# Patient Record
Sex: Male | Born: 1995 | Race: Black or African American | Hispanic: No | Marital: Single | Smoking: Current some day smoker
Health system: Southern US, Community
[De-identification: ages and names within clinical notes are randomized; demographics above are authoritative.]

---

## 2017-05-02 ENCOUNTER — Emergency Department (HOSPITAL_COMMUNITY): Payer: BLUE CROSS/BLUE SHIELD

## 2017-05-02 ENCOUNTER — Encounter (HOSPITAL_COMMUNITY): Payer: Self-pay | Admitting: Emergency Medicine

## 2017-05-02 ENCOUNTER — Emergency Department (HOSPITAL_COMMUNITY)
Admission: EM | Admit: 2017-05-02 | Discharge: 2017-05-02 | Disposition: A | Discharge status: Home / Self Care | Payer: BLUE CROSS/BLUE SHIELD | Attending: Emergency Medicine | Admitting: Emergency Medicine

## 2017-05-02 DIAGNOSIS — R0789 Other chest pain: Secondary | ICD-10-CM | POA: Diagnosis not present

## 2017-05-02 DIAGNOSIS — R079 Chest pain, unspecified: Secondary | ICD-10-CM | POA: Diagnosis present

## 2017-05-02 DIAGNOSIS — K30 Functional dyspepsia: Secondary | ICD-10-CM | POA: Diagnosis not present

## 2017-05-02 DIAGNOSIS — F17210 Nicotine dependence, cigarettes, uncomplicated: Secondary | ICD-10-CM | POA: Diagnosis not present

## 2017-05-02 LAB — BASIC METABOLIC PANEL
ANION GAP: 10 (ref 5–15)
BUN: 11 mg/dL (ref 6–20)
CALCIUM: 9.6 mg/dL (ref 8.9–10.3)
CO2: 27 mmol/L (ref 22–32)
Chloride: 101 mmol/L (ref 101–111)
Creatinine, Ser: 1.03 mg/dL (ref 0.61–1.24)
Glucose, Bld: 93 mg/dL (ref 65–99)
POTASSIUM: 4.5 mmol/L (ref 3.5–5.1)
Sodium: 138 mmol/L (ref 135–145)

## 2017-05-02 LAB — CBC
HCT: 46.8 % (ref 39.0–52.0)
HEMOGLOBIN: 16.3 g/dL (ref 13.0–17.0)
MCH: 30.5 pg (ref 26.0–34.0)
MCHC: 34.8 g/dL (ref 30.0–36.0)
MCV: 87.6 fL (ref 78.0–100.0)
Platelets: 238 10*3/uL (ref 150–400)
RBC: 5.34 MIL/uL (ref 4.22–5.81)
RDW: 13.6 % (ref 11.5–15.5)
WBC: 8.9 10*3/uL (ref 4.0–10.5)

## 2017-05-02 LAB — POCT I-STAT TROPONIN I: TROPONIN I, POC: 0 ng/mL (ref 0.00–0.08)

## 2017-05-02 MED ORDER — IBUPROFEN 600 MG PO TABS: 600.0000 mg | ORAL_TABLET | Freq: Three times a day (TID) | ORAL | 0 refills | Status: DC

## 2017-05-02 MED ORDER — GI COCKTAIL ~~LOC~~
30.0000 mL | Freq: Once | ORAL | Status: AC
  Administered 2017-05-02: 30 mL via ORAL
  Filled 2017-05-02: qty 30

## 2017-05-02 MED ORDER — KETOROLAC TROMETHAMINE 15 MG/ML IJ SOLN
30.0000 mg | Freq: Once | INTRAMUSCULAR | Status: AC
  Administered 2017-05-02: 30 mg via INTRAMUSCULAR
  Filled 2017-05-02: qty 2

## 2017-05-02 NOTE — ED Provider Notes (Signed)
[facility]-EMERGENCY DEPT Provider Note   CSN: [id number] Arrival date & time: 05/02/17  1140  History   Chief Complaint Chief Complaint  Patient presents with  . Chest Pain    HPI Jared BudgeDeven Muns is a 21 y.o. male.  HPI   21 year old male presents today with complaints of chest tightness.  Patient notes last night he started to develop upper abdominal discomfort and chest tightness.  He reports the comfort was noted in the central chest and upper abdomen.  He reports that laying down caused worsening of symptoms, and seemed to feel restless last night.  Patient notes associated nausea and one episode of vomiting this morning.  Patient denies any preceding infectious etiology including fever chills, cough congestion shortness of breath.  Patient notes that he had a general sensation of tightness and minor shortness of breath that has improved now.  Patient reports going [facility] the health clinic [facility] and being referred to the ED. patient denies any symptoms similar to this previous.  Patient denies any drug or alcohol use.  Patient reports he is a non-smoker, but did smoke several times throughout the summer.  He denies any cardiac history in himself or his family.  Denies any risk factors for DVT or PE.  No lower abdominal pain, or changes in bowel or bladder habits/characteristics.  Patient did not take any medications prior to arrival.  History reviewed. No pertinent past medical history.  There are no active problems to display for this patient.   History reviewed. No pertinent surgical history.    Home Medications    Prior to Admission medications   Medication Sig Start Date End Date Taking? Authorizing Provider  ibuprofen (ADVIL,MOTRIN) 600 MG tablet Take 1 tablet (600 mg total) by mouth 3 (three) times daily. 05/02/17   Eyvonne MechanicHedges, Thom Ollinger, PA-C    Family History History reviewed. No pertinent family history.  Social History Social History  Substance Use Topics  . Smoking status:  Current Some Day Smoker  . Smokeless tobacco: Not on file  . Alcohol use No     Allergies   Penicillins   Review of Systems Review of Systems  All other systems reviewed and are negative.  Physical Exam Updated Vital Signs BP 126/70 (BP Location: Right Arm)   Pulse 90   Temp 98.5 F (36.9 C) (Oral)   Resp 18   Ht 5\' 11"  (1.803 m)   Wt 65.8 kg (145 lb)   SpO2 99%   BMI 20.22 kg/m   Physical Exam  Constitutional: He is oriented to person, place, and time. He appears well-developed and well-nourished.  HENT:  Head: Normocephalic and atraumatic.  Eyes: Pupils are equal, round, and reactive to light. Conjunctivae are normal. Right eye exhibits no discharge. Left eye exhibits no discharge. No scleral icterus.  Neck: Normal range of motion. No JVD present. No tracheal deviation present.  Cardiovascular: Normal rate, regular rhythm and intact distal pulses.  Exam reveals no gallop and no friction rub.   Murmur heard. Soft systolic murmur   Pulmonary/Chest: Effort normal and breath sounds normal. No stridor. No respiratory distress. He has no wheezes. He has no rales. He exhibits no tenderness.  Abdominal: Soft. He exhibits no distension and no mass. There is no tenderness. There is no rebound and no guarding. No hernia.  Musculoskeletal: Normal range of motion. He exhibits no edema.  Neurological: He is alert and oriented to person, place, and time. Coordination normal.  Skin: Skin is warm.  Psychiatric: He  has a normal mood and affect. His behavior is normal. Judgment and thought content normal.  Nursing note and vitals reviewed.   ED Treatments / Results  Labs (all labs ordered are listed, but only abnormal results are displayed) Labs Reviewed  BASIC METABOLIC PANEL  CBC  I-STAT TROPONIN, ED  POCT I-STAT TROPONIN I    EKG  EKG Interpretation  Date/Time:  Wednesday May 02 2017 11:49:48 EDT Ventricular Rate:  87 PR Interval:    QRS Duration: 85 QT  Interval:  355 QTC Calculation: 427 R Axis:   90 Text Interpretation:  Sinus rhythm Borderline right axis deviation ST elevation suggests acute pericarditis No old tracing to compare Confirmed by Mancel Bale [phone]) on 05/02/2017 12:50:08 PM      Radiology Dg Chest 2 View  Result Date: 05/02/2017 CLINICAL DATA:  Chest pain and shortness of breath EXAM: CHEST  2 VIEW COMPARISON:  None. FINDINGS: Lungs are clear. Heart size and pulmonary vascularity are normal. No adenopathy. No pneumothorax. There is midthoracic dextroscoliosis. There is slight thoracic lordosis. IMPRESSION: No edema or consolidation. Electronically Signed   By: Bretta Bang III M.D.   On: 05/02/2017 12:22    Procedures Procedures (including critical care time)  Medications Ordered in ED Medications  ketorolac (TORADOL) 15 MG/ML injection 30 mg (30 mg Intramuscular Given 05/02/17 1336)  gi cocktail (Maalox,Lidocaine,Donnatal) (30 mLs Oral Given 05/02/17 1439)     Initial Impression / Assessment and Plan / ED Course  I have reviewed the triage vital signs and the nursing notes.  Pertinent labs & imaging results that were available during my care of the patient were reviewed by me and considered in my medical decision making (see chart for details).     Final Clinical Impressions(s) / ED Diagnoses   Final diagnoses:  Atypical chest pain  Indigestion   Labs:i stat trop, bmp, cbc  Imaging: DG chest 2 view  Consults:  Therapeutics:  Discharge Meds:   Assessment/Plan: 21 year old male presents today with chest pain.  Patient has what appears to be indigestion, but also has chest pain symptoms and EKG that is does show questionable diffuse ST elevation.  Question pericarditis in this patient.  He is hemodynamically stable, no signs of acute infection, very well-appearing.  Toradol completely resolved the symptoms, he will be referred to cardiology, NSAIDs, strict return precautions.  Patient verbalized  understanding and agreement to today's plan had no further questions or concerns the time discharge.     New Prescriptions New Prescriptions   IBUPROFEN (ADVIL,MOTRIN) 600 MG TABLET    Take 1 tablet (600 mg total) by mouth 3 (three) times daily.     Eyvonne Mechanic, PA-C 05/02/17 1524    Mancel Bale, MD 05/04/17 [phone]

## 2017-05-02 NOTE — Discharge Instructions (Signed)
Please read attached information. If you experience any new or worsening signs or symptoms please return to the emergency room for evaluation. Please follow-up with your primary care provider or specialist as discussed. Please use medication prescribed only as directed and discontinue taking if you have any concerning signs or symptoms.   °

## 2017-05-02 NOTE — ED Triage Notes (Addendum)
Pt reports SOB, central chest pain and tightness, abdominal pain onset last night, 1 episode yellow bile emesis today at 0700, causing relief of pain and decreased SOB. Chest still feels mildly tight. Pt initially went to health center, which sent him here due to EKG abnormalities.

## 2017-05-07 ENCOUNTER — Encounter: Payer: Self-pay | Admitting: Cardiology

## 2017-05-07 ENCOUNTER — Ambulatory Visit (INDEPENDENT_AMBULATORY_CARE_PROVIDER_SITE_OTHER): Payer: BLUE CROSS/BLUE SHIELD | Admitting: Cardiology

## 2017-05-07 DIAGNOSIS — R079 Chest pain, unspecified: Secondary | ICD-10-CM

## 2017-05-07 NOTE — Progress Notes (Signed)
Cardiology Office Note:    Date:  05/07/2017   ID:  Jared Price, DOB [birth year], MRN [id number]  PCP:  Patient, No Pcp Per  Cardiologist:  Garwin Brothers, MD   Referring MD: No ref. provider found    ASSESSMENT:    1. Chest pain, unspecified type    PLAN:    In order of problems listed above:  1. I reassured the patient about my findings today. I mentioned to him that I agree with the initial evaluation suggesting the possibility of pericarditis. Admit the patient to the breaths and cough with which he had no reproduction of the symptoms. He is taking ibuprofen activity. I told him that he could discontinue in a day or 2. Precautions and risks explained and he verbalized understanding. Again he is completely asymptomatic at this time. I would like to stress him with the complaint treadmill stress test and an echocardiogram to reassured him about a aforementioned episode. He'll be seen in follow-up appointment in 2 months or earlier if any concerns. He knows to go to the nearest emergency room for any significant concerns.   Medication Adjustments/Labs and Tests Ordered: Current medicines are reviewed at length with the patient today.  Concerns regarding medicines are outlined above.  Orders Placed This Encounter  Procedures  . ECHOCARDIOGRAM COMPLETE   No orders of the defined types were placed in this encounter.    History of Present Illness:    Jared Price is a 21 y.o. male who is being seen today for the evaluation of Chest pain at the request of emergency room physician. The patient was in the emergency room on the 15th. He mentions to me that he started having some chest discomfort and some shortness of breath and to the emergency room. In the emergency room his initial evaluation was unremarkable and the patient was told that they were not sure as to what his chest pain was coming from. They considered pericarditis as a possibility and treated him for this. The patient has  done better. He upon questioning mentions to me that he did have chest pain when taking deep breaths. At the time of my evaluation is alert awake oriented and in no distress and is accompanied by mother. He tells me that all his symptoms have now resolved. He is taking ibuprofen as prescribed by the physician. Emergency room records at extensive length. The patient and his mother had multiple questions were answered to their satisfaction.  History reviewed. No pertinent past medical history.  History reviewed. No pertinent surgical history.  Current Medications: Current Meds  Medication Sig  . ibuprofen (ADVIL,MOTRIN) 600 MG tablet Take 1 tablet (600 mg total) by mouth 3 (three) times daily.     Allergies:   Penicillins   Social History   Social History  . Marital status: Single    Spouse name: N/A  . Number of children: N/A  . Years of education: N/A   Social History Main Topics  . Smoking status: Current Some Day Smoker    Types: Cigarettes  . Smokeless tobacco: Never Used  . Alcohol use Yes  . Drug use: No  . Sexual activity: Not Asked   Other Topics Concern  . None   Social History Narrative  . None     Family History: The patient's family history includes Atrial fibrillation in his maternal grandfather.  ROS:   Please see the history of present illness.    All other systems reviewed and are negative.  EKGs/Labs/Other Studies Reviewed:    The following studies were reviewed today: I reviewed emergency room records and answered to patient's satisfaction.   Recent Labs: 05/02/2017: BUN 11; Creatinine, Ser 1.03; Hemoglobin 16.3; Platelets 238; Potassium 4.5; Sodium 138  Recent Lipid Panel No results found for: CHOL, TRIG, HDL, CHOLHDL, VLDL, LDLCALC, LDLDIRECT  Physical Exam:    VS:  BP 106/74   Pulse 86   Ht 6' (1.829 m)   Wt 141 lb 1.3 oz (64 kg)   SpO2 99%   BMI 19.13 kg/m     Wt Readings from Last 3 Encounters:  05/07/17 141 lb 1.3 oz (64 kg) (28  %, Z= -0.59)*  05/02/17 145 lb (65.8 kg) (34 %, Z= -0.40)*   * Growth percentiles are based on CDC 2-20 Years data.     GEN: Patient is in no acute distress HEENT: Normal NECK: No JVD; No carotid bruits LYMPHATICS: No lymphadenopathy CARDIAC: S1 S2 regular, 2/6 systolic murmur at the apex. RESPIRATORY:  Clear to auscultation without rales, wheezing or rhonchi  ABDOMEN: Soft, non-tender, non-distended MUSCULOSKELETAL:  No edema; No deformity  SKIN: Warm and dry NEUROLOGIC:  Alert and oriented x 3 PSYCHIATRIC:  Normal affect    Signed, Garwin Brothers, MD  05/07/2017 12:03 PM    [facility]

## 2017-05-07 NOTE — Patient Instructions (Signed)
Medication Instructions:  Your physician recommends that you continue on your current medications as directed. Please refer to the Current Medication list given to you today.   Labwork: NONE  Testing/Procedures: Your physician has requested that you have an echocardiogram. Echocardiography is a painless test that uses sound waves to create images of your heart. It provides your doctor with information about the size and shape of your heart and how well your heart's chambers and valves are working. This procedure takes approximately one hour. There are no restrictions for this procedure.  Your physician has requested that you have an exercise tolerance test. For further information please visit [email or web address] Please also follow instruction sheet, as given.    Follow-Up: Your physician recommends that you schedule a follow-up appointment in:  2 months    Any Other Special Instructions Will Be Listed Below (If Applicable).     If you need a refill on your cardiac medications before your next appointment, please call your pharmacy.

## 2017-05-07 NOTE — Addendum Note (Signed)
Addended by: Domingo Madeira on: 05/07/2017 12:27 PM   Modules accepted: Orders

## 2017-05-16 ENCOUNTER — Ambulatory Visit (HOSPITAL_COMMUNITY): Payer: BLUE CROSS/BLUE SHIELD | Attending: Cardiology

## 2017-05-16 ENCOUNTER — Ambulatory Visit (INDEPENDENT_AMBULATORY_CARE_PROVIDER_SITE_OTHER): Payer: BLUE CROSS/BLUE SHIELD

## 2017-05-16 ENCOUNTER — Other Ambulatory Visit: Payer: Self-pay

## 2017-05-16 DIAGNOSIS — R079 Chest pain, unspecified: Secondary | ICD-10-CM | POA: Insufficient documentation

## 2017-05-16 DIAGNOSIS — Z720 Tobacco use: Secondary | ICD-10-CM | POA: Insufficient documentation

## 2017-05-16 DIAGNOSIS — I341 Nonrheumatic mitral (valve) prolapse: Secondary | ICD-10-CM | POA: Insufficient documentation

## 2017-05-16 LAB — EXERCISE TOLERANCE TEST
CSEPHR: 97 %
CSEPPHR: 196 {beats}/min
Estimated workload: 15.3 METS
Exercise duration (min): 13 min
Exercise duration (sec): 0 s
MPHR: 201 {beats}/min
RPE: 15
Rest HR: 70 {beats}/min

## 2017-07-09 ENCOUNTER — Ambulatory Visit: Payer: BLUE CROSS/BLUE SHIELD | Admitting: Cardiology

## 2017-07-17 ENCOUNTER — Ambulatory Visit (INDEPENDENT_AMBULATORY_CARE_PROVIDER_SITE_OTHER): Payer: BLUE CROSS/BLUE SHIELD | Admitting: Cardiology

## 2017-07-17 ENCOUNTER — Encounter: Payer: Self-pay | Admitting: Cardiology

## 2017-07-17 VITALS — BP 120/70 | HR 76 | Ht 72.0 in | Wt 151.0 lb

## 2017-07-17 DIAGNOSIS — R079 Chest pain, unspecified: Secondary | ICD-10-CM | POA: Diagnosis not present

## 2017-07-17 DIAGNOSIS — I341 Nonrheumatic mitral (valve) prolapse: Secondary | ICD-10-CM | POA: Diagnosis not present

## 2017-07-17 NOTE — Patient Instructions (Signed)
Medication Instructions:  Your physician recommends that you continue on your current medications as directed. Please refer to the Current Medication list given to you today.  Labwork: None  Testing/Procedures: None  Follow-Up: Your physician recommends that you schedule a follow-up appointment in: 6 months  Any Other Special Instructions Will Be Listed Below (If Applicable).     If you need a refill on your cardiac medications before your next appointment, please call your pharmacy.   [facility]  Edilson Vital A, RN, BSN  

## 2017-07-17 NOTE — Progress Notes (Signed)
Cardiology Office Note:    Date:  07/17/2017   ID:  Jared Price, DOB September 25, 1995, MRN [id number]  PCP:  Patient, No Pcp Per  Cardiologist:  Garwin Brothersajan R Rally Ouch, MD   Referring MD: No ref. provider found    ASSESSMENT:    1. Chest pain, unspecified type    PLAN:    In order of problems listed above:  1. I reassured the patient about my findings. I discussed the findings with him on echocardiogram and stress echocardiogram. Extensive length and he and his mother are reassured. I told him to get to active and exercises lifestyle. Patient uses weight and I discouraged and told to quit this. Adequate hydration was emphasized. Reassurance was given a stop his excellent performance on both the test. 2. I gave education about the mild mitral valve prolapse that he has.Patient will be seen in follow-up appointment in 6 months or earlier if the patient has any concerns.    Medication Adjustments/Labs and Tests Ordered: Current medicines are reviewed at length with the patient today.  Concerns regarding medicines are outlined above.  No orders of the defined types were placed in this encounter.  No orders of the defined types were placed in this encounter.    Chief Complaint  Patient presents with  . Follow-up    doing well. Follow up post echo and stress test      History of Present Illness:    Jared Price is a 21 y.o. male . The patient was evaluated by me for chest pain. His chest pain was atypical however because of his significant concern and wanting to do strenuous physical activity I sent him for a stress echocardiogram on which she has performed excellent. His echocardiogram also has been unremarkable with only mild mitral valve prolapse.  History reviewed. No pertinent past medical history.  History reviewed. No pertinent surgical history.  Current Medications: No outpatient prescriptions have been marked as taking for the 07/17/17 encounter (Office Visit) with Dariann Huckaba, Aundra Dubinajan  R, MD.     Allergies:   Penicillins   Social History   Social History  . Marital status: Single    Spouse name: N/A  . Number of children: N/A  . Years of education: N/A   Social History Main Topics  . Smoking status: Current Some Day Smoker    Types: Cigarettes  . Smokeless tobacco: Never Used  . Alcohol use Yes  . Drug use: No  . Sexual activity: Not Asked   Other Topics Concern  . None   Social History Narrative  . None     Family History: The patient's family history includes Atrial fibrillation in his maternal grandfather.  ROS:   Please see the history of present illness.    All other systems reviewed and are negative.  EKGs/Labs/Other Studies Reviewed:    The following studies were reviewed today: I reviewed findings and discussed with the patient at extensive length.   Recent Labs: 05/02/2017: BUN 11; Creatinine, Ser 1.03; Hemoglobin 16.3; Platelets 238; Potassium 4.5; Sodium 138  Recent Lipid Panel No results found for: CHOL, TRIG, HDL, CHOLHDL, VLDL, LDLCALC, LDLDIRECT  Physical Exam:    VS:  BP 120/70   Pulse 76   Ht 6' (1.829 m)   Wt 151 lb (68.5 kg)   SpO2 98%   BMI 20.48 kg/m     Wt Readings from Last 3 Encounters:  07/17/17 151 lb (68.5 kg) (43 %, Z= -0.16)*  05/07/17 141 lb 1.3 oz (64 kg) (  28 %, Z= -0.59)*  05/02/17 145 lb (65.8 kg) (34 %, Z= -0.40)*   * Growth percentiles are based on CDC 2-20 Years data.     GEN: Patient is in no acute distress HEENT: Normal NECK: No JVD; No carotid bruits LYMPHATICS: No lymphadenopathy CARDIAC: Hear sounds regular, 2/6 systolic murmur at the apex. RESPIRATORY:  Clear to auscultation without rales, wheezing or rhonchi  ABDOMEN: Soft, non-tender, non-distended MUSCULOSKELETAL:  No edema; No deformity  SKIN: Warm and dry NEUROLOGIC:  Alert and oriented x 3 PSYCHIATRIC:  Normal affect   Signed, Garwin Brothers, MD  07/17/2017 10:44 AM    [facility]

## 2018-09-07 IMAGING — CR DG CHEST 2V
2 series · 2 of 2 positions shown · non-contrast
Comparison: None.

CLINICAL DATA: Chest pain and shortness of breath

EXAM:
CHEST  2 VIEW

[w chest pa]
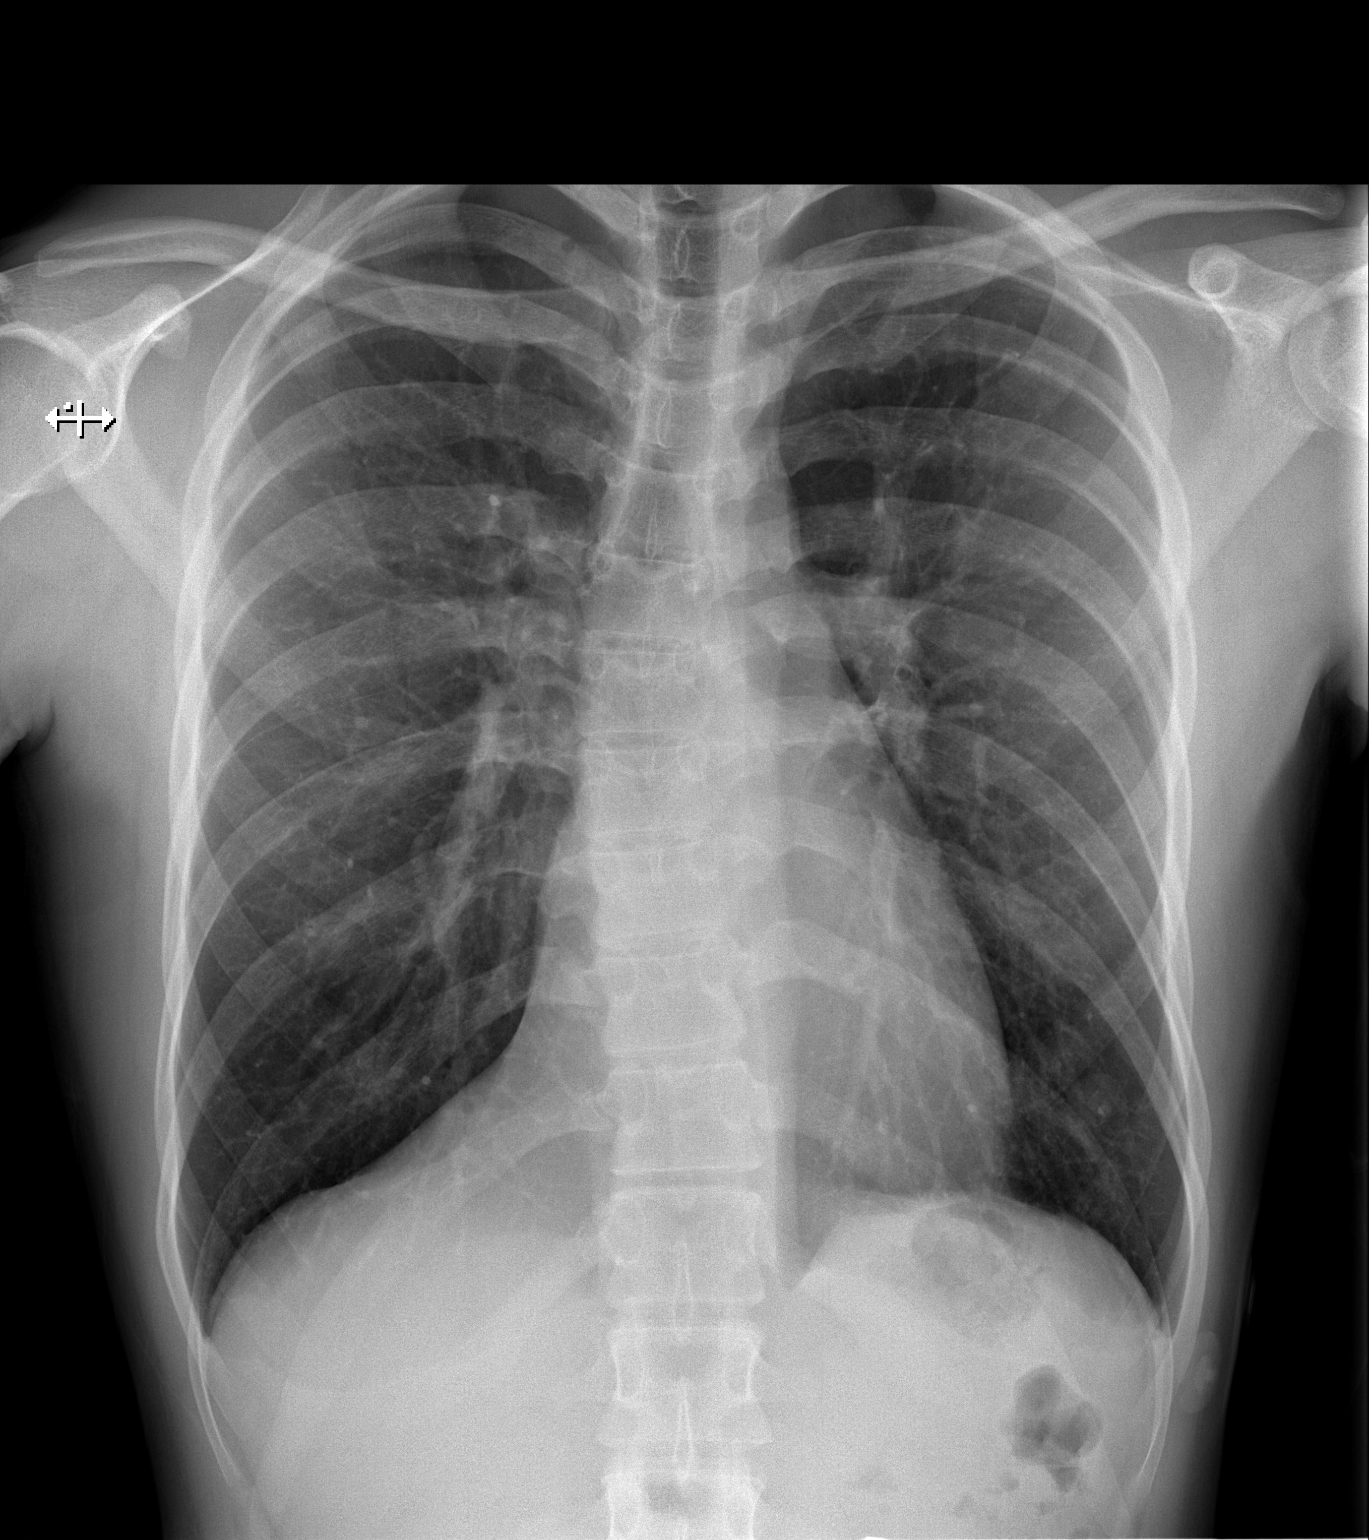

[w chest lat]
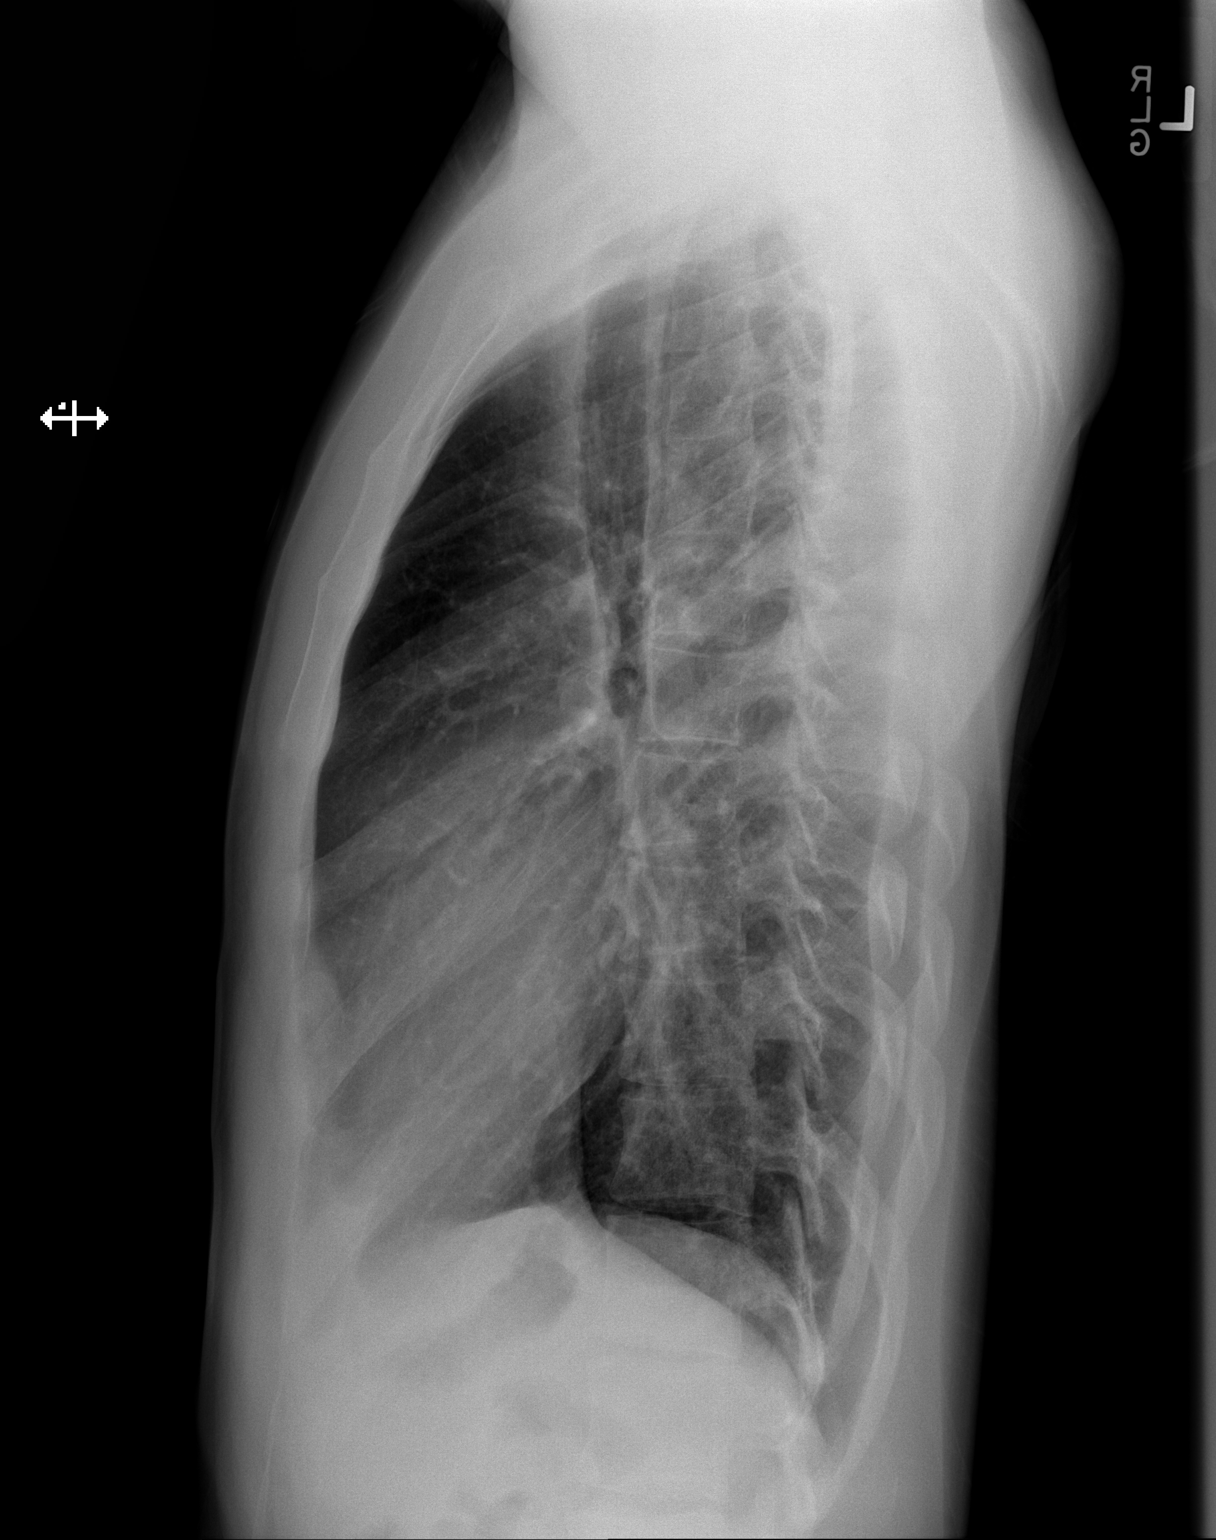

[2 of 2 positions shown; findings below may reference images not displayed]

FINDINGS: Lungs are clear. Heart size and pulmonary vascularity are normal. No
adenopathy. No pneumothorax. There is midthoracic dextroscoliosis.
There is slight thoracic lordosis.
IMPRESSION: No edema or consolidation.

## 2022-06-25 ENCOUNTER — Emergency Department (HOSPITAL_COMMUNITY): Payer: 59

## 2022-06-25 ENCOUNTER — Emergency Department (HOSPITAL_COMMUNITY)
Admission: EM | Admit: 2022-06-25 | Discharge: 2022-06-25 | Disposition: A | Discharge status: Home / Self Care | Payer: 59 | Attending: Emergency Medicine | Admitting: Emergency Medicine

## 2022-06-25 ENCOUNTER — Other Ambulatory Visit: Payer: Self-pay

## 2022-06-25 ENCOUNTER — Encounter (HOSPITAL_COMMUNITY): Payer: Self-pay

## 2022-06-25 DIAGNOSIS — S81812A Laceration without foreign body, left lower leg, initial encounter: Secondary | ICD-10-CM | POA: Diagnosis not present

## 2022-06-25 DIAGNOSIS — W25XXXA Contact with sharp glass, initial encounter: Secondary | ICD-10-CM | POA: Diagnosis not present

## 2022-06-25 DIAGNOSIS — S8992XA Unspecified injury of left lower leg, initial encounter: Secondary | ICD-10-CM | POA: Diagnosis present

## 2022-06-25 MED ORDER — LIDOCAINE-EPINEPHRINE 2 %-1:200000 IJ SOLN
10.0000 mL | Freq: Once | INTRAMUSCULAR | Status: AC
Start: 2022-06-25 — End: 2022-06-25
  Administered 2022-06-25: 10 mL
  Filled 2022-06-25: qty 20

## 2022-06-25 MED ORDER — AMMONIA AROMATIC IN INHA
RESPIRATORY_TRACT | Status: AC
  Filled 2022-06-25: qty 10

## 2022-06-25 MED ORDER — OXYCODONE-ACETAMINOPHEN 5-325 MG PO TABS
1.0000 | ORAL_TABLET | Freq: Once | ORAL | Status: AC
  Administered 2022-06-25: 1 via ORAL
  Filled 2022-06-25: qty 1

## 2022-06-25 NOTE — Discharge Instructions (Signed)
It was a pleasure taking care of you today!   Your xray didn't show any concerning findings. You may return to urgent care or return to the emergency department for suture removal in 7-10 days.  Keep the area clean and dry.  Return to the emergency department if worsening or persistent pain, drainage of wound (pus), increased swelling, or color change to area.

## 2022-06-25 NOTE — ED Provider Notes (Cosign Needed Addendum)
[facility]-EMERGENCY DEPT Provider Note   CSN: [id number] Arrival date & time: 06/25/22  1618     History  Chief Complaint  Patient presents with   Extremity Laceration    Jared Price is a 26 y.o. male who presents emergency department with concerns for right lower leg laceration onset 3 PM.  Notes that he dropped a glass which hit his lower leg.  Bleeding controlled at this time.  No anticoagulants.  Tetanus up-to-date.  No meds tried at home.   The history is provided by the patient. No language interpreter was used.       Home Medications Prior to Admission medications   Not on File      Allergies    Penicillins    Review of Systems   Review of Systems  Musculoskeletal:  Positive for arthralgias. Negative for joint swelling.  Skin:  Positive for wound.  All other systems reviewed and are negative.   Physical Exam Updated Vital Signs BP 128/88   Pulse 78   Temp 98 F (36.7 C)   Resp 20   Ht 6' (1.829 m)   Wt 68 kg   SpO2 100%   BMI 20.33 kg/m  Physical Exam Vitals and nursing note reviewed.  Constitutional:      General: He is not in acute distress.    Appearance: Normal appearance. He is not ill-appearing.  HENT:     Head: Normocephalic and atraumatic.     Right Ear: External ear normal.     Left Ear: External ear normal.  Eyes:     General: No scleral icterus. Cardiovascular:     Rate and Rhythm: Normal rate.  Pulmonary:     Effort: Pulmonary effort is normal.  Musculoskeletal:        General: Normal range of motion.     Cervical back: Normal range of motion and neck supple.     Comments: Patient able to flex and extend left foot without difficulty.  3 cm laceration noted to the left shin.   Skin:    General: Skin is warm and dry.     Capillary Refill: Capillary refill takes less than 2 seconds.     Findings: Laceration present.     Comments: 3 cm laceration noted to left shin.  Neurological:     Mental Status: He is  alert.     ED Results / Procedures / Treatments   Labs (all labs ordered are listed, but only abnormal results are displayed) Labs Reviewed - No data to display  EKG None  Radiology DG Tibia/Fibula Left  Result Date: 06/25/2022 CLINICAL DATA:  [id number] Laceration of left leg [id number] EXAM: LEFT TIBIA AND FIBULA - 2 VIEW COMPARISON:  None Available. FINDINGS: There is no evidence of fracture or other focal bone lesions. Knee and ankle grossly unremarkable. Soft tissue defect along the anterior leg. No retained radiopaque foreign body. IMPRESSION: 1. Soft tissue defect along the anterior leg. No retained radiopaque foreign body. 2.  No acute displaced fracture or dislocation. Electronically Signed   By: Tish Frederickson M.D.   On: 06/25/2022 17:26    Procedures .Marland KitchenLaceration Repair  Date/Time: 06/25/2022 6:15 PM  Performed by: Karenann Cai, PA-C Authorized by: Karenann Cai, PA-C   Consent:    Consent obtained:  Verbal and written   Consent given by:  Patient   Risks discussed:  Infection, pain and need for additional repair Universal protocol:    Imaging studies available: yes  Patient identity confirmed:  Verbally with patient and hospital-assigned identification number Anesthesia:    Anesthesia method:  Local infiltration   Local anesthetic:  Lidocaine 2% WITH epi Laceration details:    Location:  Leg   Leg location:  L lower leg   Length (cm):  3 Pre-procedure details:    Preparation:  Patient was prepped and draped in usual sterile fashion and imaging obtained to evaluate for foreign bodies Exploration:    Hemostasis achieved with:  Direct pressure and epinephrine   Imaging obtained: x-ray     Imaging outcome: foreign body not noted     Wound exploration: entire depth of wound visualized   Treatment:    Area cleansed with:  Saline   Amount of cleaning:  Standard   Irrigation solution:  Sterile saline   Irrigation method:  Syringe Skin repair:    Repair method:   Sutures   Suture size:  4-0   Suture material:  Prolene   Suture technique:  Simple interrupted   Number of sutures:  6 Approximation:    Approximation:  Close Repair type:    Repair type:  Simple Post-procedure details:    Dressing:  Sterile dressing and non-adherent dressing   Procedure completion:  Tolerated well, no immediate complications     Medications Ordered in ED Medications  ammonia inhalant (has no administration in time range)  oxyCODONE-acetaminophen (PERCOCET/ROXICET) 5-325 MG per tablet 1 tablet (1 tablet Oral Given 06/25/22 1653)    ED Course/ Medical Decision Making/ A&P                           Medical Decision Making Amount and/or Complexity of Data Reviewed Radiology: ordered.  Risk Prescription drug management.   Patient presents with laceration noted to left shin onset PTA. Pt is not on anticoagulants at this time. Vital signs, pt afebrile. On exam, patient with 3 cm laceration to left shin with full flexion and extension of left foot without difficulty. Tetanus UTD. Laceration occurred < 12 hours prior to repair. Differential diagnosis includes, fracture, foreign body, dislocation, avulsion.    Imaging: I ordered imaging studies including left tib/fib  I independently visualized and interpreted imaging which showed: no acute findings I agree with the radiologist interpretation  Medications:  I ordered medication including percocet for pain management Reevaluation of the patient after these medicines and interventions, I reevaluated the patient and found that they have improved I have reviewed the patients home medicines and have made adjustments as needed   Disposition: Presentation suspicious for laceration. Doubt fracture, dislocation, or foreign body at this time. Tetanus UTD. Wound thoroughly irrigated, no foreign bodies noted. Laceration repaired in the ED today. After consideration of the diagnostic results and the patients response to  treatment, I feel that the patient would benefit from Discharge home. Discussed laceration care with pt and answered questions. Pt to follow up for suture removal in 7-10 days and wound check sooner should there be signs of dehiscence or infection. Pt is hemodynamically stable with no complaints prior to discharge. Supportive care measures and strict return precautions discussed with patient at bedside. Pt acknowledges and verbalizes understanding. Pt appears safe for discharge. Follow up as indicated in discharge paperwork.    This chart was dictated using voice recognition software, Dragon. Despite the Rynders efforts of this provider to proofread and correct errors, errors may still occur which can change documentation meaning.  Final Clinical Impression(s) / ED Diagnoses Final  diagnoses:  Laceration of left lower extremity, initial encounter    Rx / DC Orders ED Discharge Orders     None         Mirenda Baltazar A, PA-C 06/25/22 1807    Temitayo Covalt A, PA-C 06/25/22 1816    Bethann Berkshire, MD 06/26/22 1010

## 2022-06-25 NOTE — ED Notes (Signed)
Wound irrigated with saline  

## 2022-06-25 NOTE — ED Triage Notes (Addendum)
Pt reports dropping glass and it hitting his right lower leg.  Laceration noted on arrival. Bleeding controlled. Tetanus UTD.  Ambulatory to triage

## 2022-06-25 NOTE — ED Notes (Signed)
Pt's wound covered with non-stick dressing and roller gauze.
